# Patient Record
Sex: Male | Born: 1990 | Race: Black or African American | Hispanic: No | Marital: Single | Smoking: Never smoker
Health system: Southern US, Community
[De-identification: ages and names within clinical notes are randomized; demographics above are authoritative.]

## PROBLEM LIST (undated history)

## (undated) DIAGNOSIS — R519 Headache, unspecified: Secondary | ICD-10-CM

## (undated) DIAGNOSIS — R51 Headache: Secondary | ICD-10-CM

---
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
## 2012-05-09 ENCOUNTER — Encounter (HOSPITAL_BASED_OUTPATIENT_CLINIC_OR_DEPARTMENT_OTHER): Payer: Self-pay | Admitting: *Deleted

## 2012-05-09 ENCOUNTER — Emergency Department (HOSPITAL_BASED_OUTPATIENT_CLINIC_OR_DEPARTMENT_OTHER): Payer: Medicaid Other

## 2012-05-09 ENCOUNTER — Emergency Department (HOSPITAL_BASED_OUTPATIENT_CLINIC_OR_DEPARTMENT_OTHER)
Admission: EM | Admit: 2012-05-09 | Discharge: 2012-05-09 | Disposition: A | Discharge status: Home / Self Care | Payer: Medicaid Other | Attending: Emergency Medicine | Admitting: Emergency Medicine

## 2012-05-09 DIAGNOSIS — Y9367 Activity, basketball: Secondary | ICD-10-CM | POA: Insufficient documentation

## 2012-05-09 DIAGNOSIS — S99929A Unspecified injury of unspecified foot, initial encounter: Secondary | ICD-10-CM | POA: Insufficient documentation

## 2012-05-09 DIAGNOSIS — S8991XA Unspecified injury of right lower leg, initial encounter: Secondary | ICD-10-CM

## 2012-05-09 DIAGNOSIS — S8990XA Unspecified injury of unspecified lower leg, initial encounter: Secondary | ICD-10-CM | POA: Insufficient documentation

## 2012-05-09 DIAGNOSIS — X500XXA Overexertion from strenuous movement or load, initial encounter: Secondary | ICD-10-CM | POA: Insufficient documentation

## 2012-05-09 MED ORDER — HYDROCODONE-ACETAMINOPHEN 5-500 MG PO TABS: 1.0000 | ORAL_TABLET | Freq: Four times a day (QID) | ORAL | Status: AC | PRN

## 2012-05-09 MED ORDER — IBUPROFEN 800 MG PO TABS: 800.0000 mg | ORAL_TABLET | Freq: Three times a day (TID) | ORAL | Status: AC | PRN

## 2012-05-09 MED ORDER — IBUPROFEN 800 MG PO TABS
800.0000 mg | ORAL_TABLET | Freq: Once | ORAL | Status: AC
  Administered 2012-05-09: 800 mg via ORAL
  Filled 2012-05-09: qty 1

## 2012-05-09 NOTE — ED Notes (Signed)
Pt c/o right knee injury today while playing basketball

## 2012-05-09 NOTE — Discharge Instructions (Signed)
No weight-bearing until seen by orthopedics.

## 2012-05-09 NOTE — ED Provider Notes (Signed)
History   This chart was scribed for Cyndra Numbers, MD by Toya Smothers. The patient was seen in room MH04/MH04. Patient's care was started at 1758.  CSN: [id number]  Arrival date & time 05/09/12  1758   First MD Initiated Contact with Patient 05/09/12 1823      Chief Complaint  Patient presents with  . Knee Injury    The history is provided by the patient. No language interpreter was used.   Kyle House is a 21 y.o. male who presents to the Emergency Department complaining of sudden onset moderate severe constant R knee pain onset today just prior to arrival. Pt states that he was playing basketball and twisted his knee. Pt has taken no medication and states that he has treated the pain with ice, and that walking aggravates but does not worsen pain. Pain is rated as a 6/10 at rest and sharp.  Pt has no surgical or medical history. There are no other associated or modifying factors.   History reviewed. No pertinent past medical history.  History reviewed. No pertinent past surgical history.  History reviewed. No pertinent family history.  History  Substance Use Topics  . Smoking status: Never Smoker   . Smokeless tobacco: Not on file  . Alcohol Use: No    Review of Systems  Constitutional: Negative.  Negative for fever and chills.  HENT: Negative.  Negative for rhinorrhea and neck pain.   Eyes: Negative.  Negative for pain.  Respiratory: Negative.  Negative for cough and shortness of breath.   Cardiovascular: Negative.  Negative for chest pain.  Gastrointestinal: Negative.  Negative for nausea, vomiting, abdominal pain and diarrhea.  Musculoskeletal: Negative for back pain and gait problem. Arthralgias: R knee pain.       Right knee pain as noted in HPI  Skin: Negative.  Negative for rash.  Neurological: Negative.  Negative for dizziness and weakness.  Hematological: Negative.   Psychiatric/Behavioral: Negative.   All other systems reviewed and are negative.    Allergies    Review of patient's allergies indicates no known allergies.  Home Medications  No current outpatient prescriptions on file.  BP 154/89  Pulse 84  Temp(Src) 98.6 F (37 C) (Oral)  Resp 16  Ht 6\' 1"  (1.854 m)  Wt 200 lb (90.719 kg)  BMI 26.39 kg/m2  SpO2 100%  Physical Exam  Nursing note and vitals reviewed. Constitutional: He is oriented to person, place, and time. He appears well-developed and well-nourished. No distress.  HENT:  Head: Normocephalic and atraumatic.  Eyes: EOM are normal. Pupils are equal, round, and reactive to light.  Neck: Normal range of motion. No tracheal deviation present.  Cardiovascular: Normal rate and regular rhythm.   Pulmonary/Chest: Effort normal. No respiratory distress.  Musculoskeletal: Normal range of motion. He exhibits tenderness. He exhibits no edema.       Tenderness to palpation on varus stress of the right knee. Tenderness on palpation of R lateral Tibial Plateau. No significant ligamentous instability noted.  Neurological: He is alert and oriented to person, place, and time. No sensory deficit. He exhibits normal muscle tone. Coordination normal.  Skin: Skin is warm and dry. No rash noted.  Psychiatric: He has a normal mood and affect. His behavior is normal.    ED Course  Procedures (including critical care time)  DIAGNOSTIC STUDIES: Oxygen Saturation is 100% on room air, normal by my interpretation.    COORDINATION OF CARE: 18:34- Refered to Orthopedist, recommended Ibuprofen and Ice.  Labs  Reviewed - No data to display Dg Knee Complete 4 Views Right  05/09/2012  *RADIOLOGY REPORT*  Clinical Data: Knee injury playing basketball.  RIGHT KNEE - COMPLETE 4+ VIEW  Comparison: None.  Findings: Tiny cortical fragment is identified adjacent to the lateral tibial plateau which may be related to a lateral collateral or posterior corner attachment injury.  No gross fracture.  No subluxation or dislocation.  No substantial effusion is evident  within the suprapatellar bursa.  IMPRESSION: Probable tiny cortical avulsion injury from the non articular cortex of the lateral tibial plateau.  MRI could be used to further evaluate as clinically warranted.  Original Report Authenticated By: ERIC A. MANSELL, M.D.   1. Right knee injury, initial encounter     MDM  Patient was evaluated by myself.  Based on presentation I had no concern for infection or septic joint but was concerned over possible fracture or ligamentous injury.  I reviewed complete right knee series including PA, lateral, and both oblique knee views where small bony fragment was noted at the right lateral tibial plateau.  This was in agreement with radiologist interpretation.  Patient did not have gross ligamentous instability on exam.  He was treated with NSAIDs and discharged with prescription for this and vicodin.  He was told not to bear weight until follow-up with the orthopedist.  Referral provided as well as ice pack and knee immobilizer.  Patient presented with his own crutches.  Patient likely has a meniscal tear.  He was discharged in good condition. I personally performed the services described in this documentation, which was scribed in my presence. The recorded information has been reviewed and considered.         Cyndra Numbers, MD 05/11/12 [phone]

## 2012-06-02 ENCOUNTER — Encounter (HOSPITAL_COMMUNITY): Payer: Self-pay | Admitting: Pharmacy Technician

## 2012-06-03 ENCOUNTER — Other Ambulatory Visit (HOSPITAL_COMMUNITY): Payer: Self-pay | Admitting: Orthopedic Surgery

## 2012-06-06 ENCOUNTER — Encounter (HOSPITAL_COMMUNITY): Payer: Self-pay

## 2012-06-06 ENCOUNTER — Encounter (HOSPITAL_COMMUNITY)
Admission: RE | Admit: 2012-06-06 | Discharge: 2012-06-06 | Disposition: A | Discharge status: Home / Self Care | Payer: Medicaid Other | Source: Ambulatory Visit | Attending: Orthopedic Surgery | Admitting: Orthopedic Surgery

## 2012-06-06 LAB — CBC
MCHC: 34.1 g/dL (ref 30.0–36.0)
RDW: 12.4 % (ref 11.5–15.5)

## 2012-06-06 LAB — BASIC METABOLIC PANEL
BUN: 15 mg/dL (ref 6–23)
Creatinine, Ser: 1.04 mg/dL (ref 0.50–1.35)
GFR calc Af Amer: >90 mL/min (ref >90)
GFR calc non Af Amer: >90 mL/min (ref >90)

## 2012-06-06 LAB — SURGICAL PCR SCREEN: MRSA, PCR: NEGATIVE

## 2012-06-06 NOTE — Progress Notes (Signed)
Patient does not have a primary physician. Kyle House

## 2012-06-06 NOTE — Pre-Procedure Instructions (Signed)
20 Kyle House  06/06/2012   Your procedure is scheduled on:  Tues, July 2 @ 11:20 AM  Report to [facility] at 9:15 AM.  Call this number if you have problems the morning of surgery: [phone]   Remember:   Do not eat food:After Midnight.     Do not wear jewelry  Do not wear lotions, powders, or cologne   Men may shave face and neck.  Do not bring valuables to the hospital.  Contacts, dentures or bridgework may not be worn into surgery.  Leave suitcase in the car. After surgery it may be brought to your room.  For patients admitted to the hospital, checkout time is 11:00 AM the day of discharge.   Patients discharged the day of surgery will not be allowed to drive home.  Special Instructions: CHG Shower Use Special Wash: 1/2 bottle night before surgery and 1/2 bottle morning of surgery.   Please read over the following fact sheets that you were given: Pain Booklet, Coughing and Deep Breathing, MRSA Information and Surgical Site Infection Prevention

## 2012-06-06 NOTE — Progress Notes (Signed)
Notified Dr. Diamantina Providence office regarding need for orders for patient surgery tomorrow. Kyle House

## 2012-06-07 ENCOUNTER — Inpatient Hospital Stay (HOSPITAL_COMMUNITY)
Admission: RE | Admit: 2012-06-07 | Discharge: 2012-06-08 | DRG: 489 | Disposition: A | Discharge status: Home / Self Care | Payer: Medicaid Other | Source: Ambulatory Visit | Attending: Orthopedic Surgery | Admitting: Orthopedic Surgery

## 2012-06-07 ENCOUNTER — Encounter (HOSPITAL_COMMUNITY)
Admission: RE | Disposition: A | Discharge status: Home / Self Care | Payer: Self-pay | Source: Ambulatory Visit | Attending: Orthopedic Surgery

## 2012-06-07 ENCOUNTER — Encounter (HOSPITAL_COMMUNITY): Payer: Self-pay | Admitting: Certified Registered"

## 2012-06-07 ENCOUNTER — Ambulatory Visit (HOSPITAL_COMMUNITY): Payer: Medicaid Other | Admitting: Certified Registered"

## 2012-06-07 ENCOUNTER — Encounter (HOSPITAL_COMMUNITY): Payer: Self-pay | Admitting: *Deleted

## 2012-06-07 DIAGNOSIS — S83509A Sprain of unspecified cruciate ligament of unspecified knee, initial encounter: Principal | ICD-10-CM | POA: Diagnosis present

## 2012-06-07 DIAGNOSIS — S83289A Other tear of lateral meniscus, current injury, unspecified knee, initial encounter: Secondary | ICD-10-CM | POA: Diagnosis present

## 2012-06-07 DIAGNOSIS — S83519A Sprain of anterior cruciate ligament of unspecified knee, initial encounter: Secondary | ICD-10-CM

## 2012-06-07 DIAGNOSIS — X58XXXA Exposure to other specified factors, initial encounter: Secondary | ICD-10-CM | POA: Diagnosis present

## 2012-06-07 DIAGNOSIS — Z01812 Encounter for preprocedural laboratory examination: Secondary | ICD-10-CM

## 2012-06-07 HISTORY — PX: ANTERIOR CRUCIATE LIGAMENT REPAIR: SHX115

## 2012-06-07 SURGERY — RECONSTRUCTION, KNEE, ACL, USING HAMSTRING GRAFT
Anesthesia: General | Site: Knee | Laterality: Right | Wound class: Clean

## 2012-06-07 MED ORDER — HYDROMORPHONE HCL PF 1 MG/ML IJ SOLN
0.2500 mg | INTRAMUSCULAR | Status: DC | PRN
  Administered 2012-06-07 (×3): 0.5 mg via INTRAVENOUS

## 2012-06-07 MED ORDER — NEOSTIGMINE METHYLSULFATE 1 MG/ML IJ SOLN
INTRAMUSCULAR | Status: DC | PRN
  Administered 2012-06-07: 2 mg via INTRAVENOUS

## 2012-06-07 MED ORDER — ONDANSETRON HCL 4 MG PO TABS: 4.0000 mg | ORAL_TABLET | Freq: Four times a day (QID) | ORAL | Status: DC | PRN

## 2012-06-07 MED ORDER — CEFAZOLIN SODIUM 1-5 GM-% IV SOLN
INTRAVENOUS | Status: DC | PRN
  Administered 2012-06-07: 2 g via INTRAVENOUS

## 2012-06-07 MED ORDER — KETOROLAC TROMETHAMINE 30 MG/ML IJ SOLN
INTRAMUSCULAR | Status: AC
  Filled 2012-06-07: qty 1

## 2012-06-07 MED ORDER — ROCURONIUM BROMIDE 100 MG/10ML IV SOLN
INTRAVENOUS | Status: DC | PRN
  Administered 2012-06-07: 50 mg via INTRAVENOUS
  Administered 2012-06-07: 10 mg via INTRAVENOUS

## 2012-06-07 MED ORDER — METOCLOPRAMIDE HCL 10 MG PO TABS: 5.0000 mg | ORAL_TABLET | Freq: Three times a day (TID) | ORAL | Status: DC | PRN

## 2012-06-07 MED ORDER — CEFAZOLIN SODIUM 1-5 GM-% IV SOLN
INTRAVENOUS | Status: AC
  Filled 2012-06-07: qty 100

## 2012-06-07 MED ORDER — ACETAMINOPHEN 10 MG/ML IV SOLN
INTRAVENOUS | Status: DC | PRN
  Administered 2012-06-07: 1000 mg via INTRAVENOUS

## 2012-06-07 MED ORDER — METHOCARBAMOL 500 MG PO TABS: 500.0000 mg | ORAL_TABLET | Freq: Four times a day (QID) | ORAL | Status: DC | PRN

## 2012-06-07 MED ORDER — SODIUM CHLORIDE 0.9 % IR SOLN
Status: DC | PRN
  Administered 2012-06-07 (×5): 3000 mL

## 2012-06-07 MED ORDER — PROMETHAZINE HCL 25 MG/ML IJ SOLN: 6.2500 mg | INTRAMUSCULAR | Status: DC | PRN

## 2012-06-07 MED ORDER — POTASSIUM CHLORIDE IN NACL 20-0.9 MEQ/L-% IV SOLN
INTRAVENOUS | Status: DC
  Filled 2012-06-07 (×3): qty 1000

## 2012-06-07 MED ORDER — MIDAZOLAM HCL 5 MG/5ML IJ SOLN
INTRAMUSCULAR | Status: DC | PRN
  Administered 2012-06-07 (×2): 2 mg via INTRAVENOUS

## 2012-06-07 MED ORDER — FENTANYL CITRATE 0.05 MG/ML IJ SOLN
INTRAMUSCULAR | Status: DC | PRN
  Administered 2012-06-07: 100 ug via INTRAVENOUS
  Administered 2012-06-07 (×8): 50 ug via INTRAVENOUS

## 2012-06-07 MED ORDER — METOCLOPRAMIDE HCL 5 MG/ML IJ SOLN: 5.0000 mg | Freq: Three times a day (TID) | INTRAMUSCULAR | Status: DC | PRN

## 2012-06-07 MED ORDER — GLYCOPYRROLATE 0.2 MG/ML IJ SOLN
INTRAMUSCULAR | Status: DC | PRN
  Administered 2012-06-07: 0.3 mg via INTRAVENOUS

## 2012-06-07 MED ORDER — PROPOFOL 10 MG/ML IV EMUL
INTRAVENOUS | Status: DC | PRN
  Administered 2012-06-07: 200 mg via INTRAVENOUS

## 2012-06-07 MED ORDER — 0.9 % SODIUM CHLORIDE (POUR BTL) OPTIME
TOPICAL | Status: DC | PRN
  Administered 2012-06-07: 1000 mL

## 2012-06-07 MED ORDER — HYDROMORPHONE HCL PF 1 MG/ML IJ SOLN
INTRAMUSCULAR | Status: AC
  Filled 2012-06-07: qty 1

## 2012-06-07 MED ORDER — ONDANSETRON HCL 4 MG/2ML IJ SOLN: 4.0000 mg | Freq: Four times a day (QID) | INTRAMUSCULAR | Status: DC | PRN

## 2012-06-07 MED ORDER — MORPHINE SULFATE 4 MG/ML IJ SOLN
INTRAMUSCULAR | Status: AC
  Filled 2012-06-07: qty 1

## 2012-06-07 MED ORDER — ACETAMINOPHEN 10 MG/ML IV SOLN
INTRAVENOUS | Status: AC
  Filled 2012-06-07: qty 100

## 2012-06-07 MED ORDER — CEFAZOLIN SODIUM 1-5 GM-% IV SOLN
1.0000 g | Freq: Three times a day (TID) | INTRAVENOUS | Status: DC
  Administered 2012-06-07 (×2): 1 g via INTRAVENOUS
  Filled 2012-06-07 (×3): qty 50

## 2012-06-07 MED ORDER — KETOROLAC TROMETHAMINE 30 MG/ML IJ SOLN
15.0000 mg | Freq: Once | INTRAMUSCULAR | Status: AC | PRN
  Administered 2012-06-07: 30 mg via INTRAVENOUS

## 2012-06-07 MED ORDER — METHOCARBAMOL 100 MG/ML IJ SOLN
500.0000 mg | Freq: Four times a day (QID) | INTRAVENOUS | Status: DC | PRN
  Filled 2012-06-07: qty 5

## 2012-06-07 MED ORDER — ONDANSETRON HCL 4 MG/2ML IJ SOLN
INTRAMUSCULAR | Status: DC | PRN
  Administered 2012-06-07: 4 mg via INTRAVENOUS

## 2012-06-07 MED ORDER — BUPIVACAINE HCL (PF) 0.25 % IJ SOLN
INTRAMUSCULAR | Status: DC | PRN
  Administered 2012-06-07: 5 mL

## 2012-06-07 MED ORDER — HYDROMORPHONE HCL PF 1 MG/ML IJ SOLN
0.5000 mg | INTRAMUSCULAR | Status: DC | PRN
  Administered 2012-06-07: 1 mg via INTRAVENOUS
  Filled 2012-06-07: qty 1

## 2012-06-07 MED ORDER — HYDROMORPHONE HCL PF 1 MG/ML IJ SOLN
INTRAMUSCULAR | Status: AC
  Administered 2012-06-07: 1 mg via INTRAVENOUS
  Filled 2012-06-07: qty 1

## 2012-06-07 MED ORDER — MORPHINE SULFATE 4 MG/ML IJ SOLN
INTRAMUSCULAR | Status: DC | PRN
  Administered 2012-06-07: 4 mg via INTRAVENOUS

## 2012-06-07 MED ORDER — OXYCODONE HCL 5 MG PO TABS
10.0000 mg | ORAL_TABLET | ORAL | Status: DC | PRN
  Administered 2012-06-08: 10 mg via ORAL
  Filled 2012-06-07: qty 2

## 2012-06-07 MED ORDER — METHOCARBAMOL 100 MG/ML IJ SOLN
500.0000 mg | INTRAVENOUS | Status: AC
  Administered 2012-06-07: 500 mg via INTRAVENOUS
  Filled 2012-06-07: qty 5

## 2012-06-07 MED ORDER — LIDOCAINE HCL (CARDIAC) 20 MG/ML IV SOLN
INTRAVENOUS | Status: DC | PRN
  Administered 2012-06-07: 80 mg via INTRAVENOUS

## 2012-06-07 MED ORDER — KETOROLAC TROMETHAMINE 15 MG/ML IJ SOLN
15.0000 mg | Freq: Four times a day (QID) | INTRAMUSCULAR | Status: DC
  Administered 2012-06-07 – 2012-06-08 (×3): 15 mg via INTRAVENOUS
  Filled 2012-06-07 (×5): qty 1

## 2012-06-07 MED ORDER — MEPERIDINE HCL 25 MG/ML IJ SOLN: 6.2500 mg | INTRAMUSCULAR | Status: DC | PRN

## 2012-06-07 MED ORDER — BUPIVACAINE HCL (PF) 0.25 % IJ SOLN
INTRAMUSCULAR | Status: AC
  Filled 2012-06-07: qty 30

## 2012-06-07 MED ORDER — CLONIDINE HCL (ANALGESIA) 100 MCG/ML EP SOLN
EPIDURAL | Status: DC | PRN
  Administered 2012-06-07: 1 mL

## 2012-06-07 MED ORDER — LACTATED RINGERS IV SOLN
INTRAVENOUS | Status: DC
  Administered 2012-06-07 (×2): via INTRAVENOUS

## 2012-06-07 SURGICAL SUPPLY — 98 items
ANCHOR BUTTON TIGHTROPE ACL RT (Orthopedic Implant) ×2 IMPLANT
BANDAGE ELASTIC 6 VELCRO ST LF (GAUZE/BANDAGES/DRESSINGS) IMPLANT
BANDAGE ESMARK 6X9 LF (GAUZE/BANDAGES/DRESSINGS) ×1 IMPLANT
BENZOIN TINCTURE PRP APPL 2/3 (GAUZE/BANDAGES/DRESSINGS) ×4 IMPLANT
BLADE CUDA 5.5 (BLADE) IMPLANT
BLADE GREAT WHITE 4.2 (BLADE) ×2 IMPLANT
BLADE SURG 10 STRL SS (BLADE) ×2 IMPLANT
BLADE SURG 15 STRL LF DISP TIS (BLADE) ×1 IMPLANT
BLADE SURG 15 STRL SS (BLADE) ×1
BNDG ELASTIC 6X15 VLCR STRL LF (GAUZE/BANDAGES/DRESSINGS) ×2 IMPLANT
BNDG ESMARK 6X9 LF (GAUZE/BANDAGES/DRESSINGS) ×2
BUR OVAL 6.0 (BURR) ×2 IMPLANT
CINCH MENISCAL (Anchor) ×1 IMPLANT
CLOTH BEACON ORANGE TIMEOUT ST (SAFETY) ×2 IMPLANT
COVER SURGICAL LIGHT HANDLE (MISCELLANEOUS) ×2 IMPLANT
CUFF TOURNIQUET SINGLE 34IN LL (TOURNIQUET CUFF) ×2 IMPLANT
CUFF TOURNIQUET SINGLE 44IN (TOURNIQUET CUFF) IMPLANT
CUTTER KNOT PUSHER 2-0 FIBERWI (INSTRUMENTS) ×2 IMPLANT
DECANTER SPIKE VIAL GLASS SM (MISCELLANEOUS) IMPLANT
DRAPE ARTHROSCOPY W/POUCH 114 (DRAPES) ×2 IMPLANT
DRAPE INCISE IOBAN 66X45 STRL (DRAPES) ×4 IMPLANT
DRAPE U-SHAPE 47X51 STRL (DRAPES) ×2 IMPLANT
DRILL FLIPCUTTER II 8.0MM (INSTRUMENTS) ×1 IMPLANT
DRSG PAD ABDOMINAL 8X10 ST (GAUZE/BANDAGES/DRESSINGS) ×2 IMPLANT
ELECT REM PT RETURN 9FT ADLT (ELECTROSURGICAL) ×2
ELECTRODE REM PT RTRN 9FT ADLT (ELECTROSURGICAL) ×1 IMPLANT
EVACUATOR 1/8 PVC DRAIN (DRAIN) IMPLANT
FIBERSTICK 2 (SUTURE) ×2 IMPLANT
FLIPCUTTER II 8.0MM (INSTRUMENTS) ×2
GAUZE XEROFORM 1X8 LF (GAUZE/BANDAGES/DRESSINGS) ×2 IMPLANT
GLOVE BIO SURGEON ST LM GN SZ9 (GLOVE) IMPLANT
GLOVE BIOGEL PI IND STRL 6.5 (GLOVE) ×1 IMPLANT
GLOVE BIOGEL PI IND STRL 7.0 (GLOVE) ×1 IMPLANT
GLOVE BIOGEL PI IND STRL 7.5 (GLOVE) ×1 IMPLANT
GLOVE BIOGEL PI IND STRL 8 (GLOVE) ×1 IMPLANT
GLOVE BIOGEL PI IND STRL 9 (GLOVE) IMPLANT
GLOVE BIOGEL PI INDICATOR 6.5 (GLOVE) ×1
GLOVE BIOGEL PI INDICATOR 7.0 (GLOVE) ×1
GLOVE BIOGEL PI INDICATOR 7.5 (GLOVE) ×1
GLOVE BIOGEL PI INDICATOR 8 (GLOVE) ×1
GLOVE BIOGEL PI INDICATOR 9 (GLOVE)
GLOVE ECLIPSE 7.0 STRL STRAW (GLOVE) ×4 IMPLANT
GLOVE SURG ORTHO 8.0 STRL STRW (GLOVE) ×2 IMPLANT
GLOVE SURG SS PI 6.5 STRL IVOR (GLOVE) ×2 IMPLANT
GLOVE SURG SS PI 7.5 STRL IVOR (GLOVE) ×6 IMPLANT
GOWN PREVENTION PLUS LG XLONG (DISPOSABLE) ×2 IMPLANT
GOWN PREVENTION PLUS XLARGE (GOWN DISPOSABLE) IMPLANT
GOWN STRL NON-REIN LRG LVL3 (GOWN DISPOSABLE) ×10 IMPLANT
IMMOBILIZER KNEE 24 THIGH 36 (MISCELLANEOUS) ×1 IMPLANT
IMMOBILIZER KNEE 24 UNIV (MISCELLANEOUS) ×2
KIT BASIN OR (CUSTOM PROCEDURE TRAY) ×2 IMPLANT
KIT ROOM TURNOVER OR (KITS) ×2 IMPLANT
KIT TRANSTIBIAL (DISPOSABLE) IMPLANT
MANIFOLD NEPTUNE II (INSTRUMENTS) ×2 IMPLANT
MENISCAL CINCH (Anchor) ×2 IMPLANT
NEEDLE 18GX1X1/2 (RX/OR ONLY) (NEEDLE) ×2 IMPLANT
NS IRRIG 1000ML POUR BTL (IV SOLUTION) ×2 IMPLANT
PACK ARTHROSCOPY DSU (CUSTOM PROCEDURE TRAY) ×2 IMPLANT
PAD ARMBOARD 7.5X6 YLW CONV (MISCELLANEOUS) ×2 IMPLANT
PAD CAST 4YDX4 CTTN HI CHSV (CAST SUPPLIES) IMPLANT
PADDING CAST COTTON 4X4 STRL (CAST SUPPLIES)
PADDING CAST COTTON 6X4 STRL (CAST SUPPLIES) ×6 IMPLANT
PASSER SUT SWANSON 36MM LOOP (INSTRUMENTS) IMPLANT
PENCIL BUTTON HOLSTER BLD 10FT (ELECTRODE) ×2 IMPLANT
REAMER C 10MM (INSTRUMENTS) IMPLANT
SCREW BIO INTERF 8X28 (Screw) ×2 IMPLANT
SCREW LO PRO 25MM (Screw) ×2 IMPLANT
SET ARTHROSCOPY TUBING (MISCELLANEOUS) ×1
SET ARTHROSCOPY TUBING LN (MISCELLANEOUS) ×1 IMPLANT
SPONGE GAUZE 4X4 12PLY (GAUZE/BANDAGES/DRESSINGS) ×2 IMPLANT
SPONGE LAP 4X18 X RAY DECT (DISPOSABLE) ×4 IMPLANT
SPONGE SCRUB IODOPHOR (GAUZE/BANDAGES/DRESSINGS) IMPLANT
STRIP CLOSURE SKIN 1/2X4 (GAUZE/BANDAGES/DRESSINGS) ×2 IMPLANT
SUCTION FRAZIER TIP 10 FR DISP (SUCTIONS) IMPLANT
SUT 2 FIBERLOOP 20 STRT BLUE (SUTURE) ×4
SUT ETHILON 3 0 PS 1 (SUTURE) ×2 IMPLANT
SUT FIBERWIRE #2 26 STR GREEN (SUTURE)
SUT FIBERWIRE #2 38 T-5 BLUE (SUTURE) ×2
SUT MENISCAL KIT (KITS) ×2 IMPLANT
SUT PROLENE 3 0 PS 2 (SUTURE) ×2 IMPLANT
SUT VIC AB 0 CT1 27 (SUTURE) ×2
SUT VIC AB 0 CT1 27XBRD ANBCTR (SUTURE) ×2 IMPLANT
SUT VIC AB 2-0 CT1 27 (SUTURE) ×2
SUT VIC AB 2-0 CT1 TAPERPNT 27 (SUTURE) ×2 IMPLANT
SUT VICRYL 0 TIES 12 18 (SUTURE) ×2 IMPLANT
SUTURE 2 FIBERLOOP 20 STRT BLU (SUTURE) ×2 IMPLANT
SUTURE FIBERWR #2 26 STR GREEN (SUTURE) IMPLANT
SUTURE FIBERWR #2 38 T-5 BLUE (SUTURE) ×1 IMPLANT
SYR 30ML LL (SYRINGE) IMPLANT
SYR 30ML SLIP (SYRINGE) ×2 IMPLANT
SYR BULB IRRIGATION 50ML (SYRINGE) ×2 IMPLANT
SYR TB 1ML LUER SLIP (SYRINGE) ×2 IMPLANT
TOWEL OR 17X24 6PK STRL BLUE (TOWEL DISPOSABLE) ×2 IMPLANT
TOWEL OR 17X26 10 PK STRL BLUE (TOWEL DISPOSABLE) ×2 IMPLANT
UNDERPAD 30X30 INCONTINENT (UNDERPADS AND DIAPERS) IMPLANT
WAND 90 DEG TURBOVAC W/CORD (SURGICAL WAND) IMPLANT
WATER STERILE IRR 1000ML POUR (IV SOLUTION) ×2 IMPLANT
WRAP KNEE MAXI GEL POST OP (GAUZE/BANDAGES/DRESSINGS) ×2 IMPLANT

## 2012-06-07 NOTE — Progress Notes (Signed)
Spoke with NURSE IN OR ROOM WITH DR August Saucer RE; ORDERS NOT BEING SIGNED AND CONSENT WILL NEED SIGNED ON OR.

## 2012-06-07 NOTE — Anesthesia Postprocedure Evaluation (Signed)
  Anesthesia Post-op Note  Patient: Kyle House  Procedure(s) Performed: Procedure(s) (LRB): RECONSTRUCTION ANTERIOR CRUCIATE LIGAMENT (ACL) WITH HAMSTRING GRAFT (Right)  Patient Location: PACU  Anesthesia Type: General  Level of Consciousness: awake, alert  and oriented  Airway and Oxygen Therapy: Patient Spontanous Breathing  Post-op Pain: mild  Post-op Assessment: Post-op Vital signs reviewed and Patient's Cardiovascular Status Stable  Post-op Vital Signs: stable  Complications: No apparent anesthesia complications

## 2012-06-07 NOTE — Transfer of Care (Signed)
Immediate Anesthesia Transfer of Care Note  Patient: Kyle House  Procedure(s) Performed: Procedure(s) (LRB): RECONSTRUCTION ANTERIOR CRUCIATE LIGAMENT (ACL) WITH HAMSTRING GRAFT (Right)  Patient Location: PACU  Anesthesia Type: General  Level of Consciousness: awake, alert  and oriented  Airway & Oxygen Therapy: Patient Spontanous Breathing  Post-op Assessment: Report given to PACU RN and Post -op Vital signs reviewed and stable  Post vital signs: Reviewed and stable  Complications: No apparent anesthesia complications

## 2012-06-07 NOTE — Brief Op Note (Signed)
06/07/2012  2:48 PM  PATIENT:  Kyle House  21 y.o. male  PRE-OPERATIVE DIAGNOSIS:  Right knee ACL tear, lateral meniscal tear  POST-OPERATIVE DIAGNOSIS:  Right knee ACL tear, lateral meniscal tear  PROCEDURE:  Procedure(s): RECONSTRUCTION ANTERIOR CRUCIATE LIGAMENT (ACL) WITH HAMSTRING GRAFT lateral meniscal tear repair  SURGEON:  Surgeon(s): Cammy Copa, MD  ASSISTANT: Jodene Nam  ANESTHESIA:   general  EBL: 25 ml    Total I/O In: 1000 [I.V.:1000] Out: -   BLOOD ADMINISTERED: none  DRAINS: none   LOCAL MEDICATIONS USED:  none  SPECIMEN:  No Specimen  COUNTS:  YES  TOURNIQUET:   Total Tourniquet Time Documented: Thigh (Right) - 120 minutes  DICTATION: .Other Dictation: Dictation Number [id number]  PLAN OF CARE: Admit for overnight observation  PATIENT DISPOSITION:  PACU - hemodynamically stable

## 2012-06-07 NOTE — Anesthesia Procedure Notes (Signed)
Procedure Name: Intubation Date/Time: 06/07/2012 12:03 PM Performed by: Jefm Miles E Pre-anesthesia Checklist: Patient identified, Timeout performed, Emergency Drugs available, Suction available and Patient being monitored Patient Re-evaluated:Patient Re-evaluated prior to inductionOxygen Delivery Method: Circle system utilized Preoxygenation: Pre-oxygenation with 100% oxygen Intubation Type: IV induction Ventilation: Mask ventilation without difficulty Laryngoscope Size: Mac and 3 Grade View: Grade I Tube type: Oral Tube size: 7.5 mm Number of attempts: 1 Airway Equipment and Method: Stylet Placement Confirmation: ETT inserted through vocal cords under direct vision,  breath sounds checked- equal and bilateral and positive ETCO2 Secured at: 23 cm Tube secured with: Tape Dental Injury: Teeth and Oropharynx as per pre-operative assessment

## 2012-06-07 NOTE — Progress Notes (Signed)
NOTIFIED DR MASSAGEE OF PATIENT DRINKING COFFEE MUG OF WATER 730 THIS AM.

## 2012-06-07 NOTE — H&P (Signed)
Kyle House is an 21 y.o. male.   Chief Complaint: Right knee pain HPI: Kyle House is an 21 year old patient who injured his right knee during a sporting activity several weeks ago. He described immediate pop and swelling and has had symptomatic instability since that time. Evaluation in the office demonstrated positive Lockman examination effusion stable collateral ligaments. Subsequent MRI scanning did show torn anterior cruciate ligament with potential tearing of the lateral meniscus as well. Patient desired to continue to play basketball and will be enrolling at [employer] in the fall. There is no family history of deep vein thrombosis.  History reviewed. No pertinent past medical history.  History reviewed. No pertinent past surgical history.  History reviewed. No pertinent family history. Social History:  reports that he has never smoked. He does not have any smokeless tobacco history on file. He reports that he does not drink alcohol or use illicit drugs.  Allergies: No Known Allergies  No prescriptions prior to admission    Results for orders placed during the hospital encounter of 06/06/12 (from the past 48 hour(s))  BASIC METABOLIC PANEL     Status: Normal   Collection Time   06/06/12  9:24 AM      Component Value Range Comment   Sodium 140  135 - 145 mEq/L    Potassium 4.1  3.5 - 5.1 mEq/L    Chloride 103  96 - 112 mEq/L    CO2 28  19 - 32 mEq/L    Glucose, Bld 90  70 - 99 mg/dL    BUN 15  6 - 23 mg/dL    Creatinine, Ser 1.61  0.50 - 1.35 mg/dL    Calcium 9.9  8.4 - 09.6 mg/dL    GFR calc non Af Amer >90  >90 mL/min    GFR calc Af Amer >90  >90 mL/min   CBC     Status: Normal   Collection Time   06/06/12  9:24 AM      Component Value Range Comment   WBC 7.6  4.0 - 10.5 K/uL    RBC 5.42  4.22 - 5.81 MIL/uL    Hemoglobin 15.6  13.0 - 17.0 g/dL    HCT 04.5  40.9 - 81.1 %    MCV 84.5  78.0 - 100.0 fL    MCH 28.8  26.0 - 34.0 pg    MCHC 34.1  30.0 - 36.0 g/dL    RDW 91.4   78.2 - 95.6 %    Platelets 234  150 - 400 K/uL   SURGICAL PCR SCREEN     Status: Normal   Collection Time   06/06/12  9:27 AM      Component Value Range Comment   MRSA, PCR NEGATIVE  NEGATIVE    Staphylococcus aureus NEGATIVE  NEGATIVE    No results found.  Review of Systems  Constitutional: Negative.   HENT: Negative.   Eyes: Negative.   Respiratory: Negative.   Cardiovascular: Negative.   Gastrointestinal: Negative.   Genitourinary: Negative.   Musculoskeletal: Positive for joint pain.  Skin: Negative.   Neurological: Negative.   Endo/Heme/Allergies: Negative.   Psychiatric/Behavioral: Negative.     Blood pressure 132/87, pulse 72, temperature 97.9 F (36.6 C), temperature source Oral, resp. rate 18, SpO2 100.00%. Physical Exam  Constitutional: He appears well-developed.  HENT:  Head: Normocephalic.  Eyes: Pupils are equal, round, and reactive to light.  Neck: Normal range of motion.  Cardiovascular: Normal rate.   Respiratory: Effort normal.  GI: Soft.  Neurological: He is alert.  Skin: Skin is warm.   examination of the right knee demonstrates trace effusion intact extension mechanism stable collateral ligaments positive Lockman no posterior lateral rotatory instability palpable pedal pulses soft compartments for range of motion except for the last 10 of flexion with medial and lateral joint line tenderness . Compartments are soft. Dorsiflexion plantarflexion of the foot is intact on the right.  Assessment/Plan Impression is right knee anterior cruciate ligament tear in an active patient he wants to continue playing basketball. MRI scan does show anterior cruciate ligament tear and possible lateral meniscal tearing. He does have some patellar tendinitis and therefore graft option choice will be hamstring autograft. Risk and benefits of the procedure are discussed with the patient including but not limited to infection nerve vessel damage area of numbness on the lateral  aspect of the leg AP thrombosis decrease in range of motion and inability to return to sports. The long duration of the rehabilitation process as also described to the patient in detail. Alll questions answered patient understands the risk and benefits and wishes to proceed  Kyle House SCOTT 06/07/2012, 11:27 AM

## 2012-06-07 NOTE — Anesthesia Preprocedure Evaluation (Addendum)
Anesthesia Evaluation  Patient identified by MRN, date of birth, ID band Patient awake    Reviewed: Allergy & Precautions, H&P , NPO status , Patient's Chart, lab work & pertinent test results  History of Anesthesia Complications Negative for: history of anesthetic complications  Airway Mallampati: I  Neck ROM: full    Dental No notable dental hx. (+) Teeth Intact and Dental Advisory Given   Pulmonary neg pulmonary ROS,  breath sounds clear to auscultation  Pulmonary exam normal + wheezing      Cardiovascular negative cardio ROS  IRhythm:regular Rate:Normal     Neuro/Psych negative neurological ROS  negative psych ROS   GI/Hepatic negative GI ROS, Neg liver ROS,   Endo/Other  negative endocrine ROS  Renal/GU negative Renal ROS  negative genitourinary   Musculoskeletal   Abdominal   Peds  Hematology negative hematology ROS (+)   Anesthesia Other Findings   Reproductive/Obstetrics negative OB ROS                         Anesthesia Physical Anesthesia Plan  ASA: I  Anesthesia Plan: General and General ETT   Post-op Pain Management:    Induction:   Airway Management Planned:   Additional Equipment:   Intra-op Plan:   Post-operative Plan:   Informed Consent: I have reviewed the patients History and Physical, chart, labs and discussed the procedure including the risks, benefits and alternatives for the proposed anesthesia with the patient or authorized representative who has indicated his/her understanding and acceptance.     Plan Discussed with: CRNA and Surgeon  Anesthesia Plan Comments:         Anesthesia Quick Evaluation

## 2012-06-07 NOTE — Preoperative (Signed)
Beta Blockers   Reason not to administer Beta Blockers:Not Applicable 

## 2012-06-08 ENCOUNTER — Encounter (HOSPITAL_COMMUNITY): Payer: Self-pay | Admitting: Orthopedic Surgery

## 2012-06-08 MED ORDER — ASPIRIN 325 MG PO TABS
325.0000 mg | ORAL_TABLET | Freq: Every day | ORAL | Status: DC
  Administered 2012-06-08: 325 mg via ORAL
  Filled 2012-06-08: qty 1

## 2012-06-08 MED ORDER — OXYCODONE HCL 10 MG PO TABS: 10.0000 mg | ORAL_TABLET | ORAL | Status: AC | PRN

## 2012-06-08 MED ORDER — METHOCARBAMOL 500 MG PO TABS: 500.0000 mg | ORAL_TABLET | Freq: Four times a day (QID) | ORAL | Status: AC | PRN

## 2012-06-08 MED ORDER — ASPIRIN 325 MG PO TABS: 325.0000 mg | ORAL_TABLET | Freq: Every day | ORAL | Status: AC

## 2012-06-08 NOTE — Progress Notes (Signed)
UR COMPLETED  

## 2012-06-08 NOTE — Progress Notes (Signed)
Pt stable Right foot mobilie - perfused and sensate Plan dc today - after pt and cpm PT to discuss home exercise program

## 2012-06-08 NOTE — Op Note (Signed)
NAMEARI, BERNABEI NO.:  [id number]  MEDICAL RECORD NO.:  [id number]  LOCATION:  [id number]                        FACILITY:  [facility]  PHYSICIAN:  Kyle House, M.D.    DATE OF BIRTH:  31-May-1991  DATE OF PROCEDURE:  06/07/2012 DATE OF DISCHARGE:                              OPERATIVE REPORT   PREOPERATIVE DIAGNOSIS:  Right knee anterior cruciate ligament tear, lateral meniscal tear.  POSTOPERATIVE DIAGNOSIS:  Right knee anterior cruciate ligament tear, lateral meniscal tear.  PROCEDURE:  Right knee ACL reconstruction with hamstring autograft, lateral meniscal repair, all inside.  SURGEON:  Kyle Bunting, MD  ASSISTANT:  Kyle Neighbors, PA  ANESTHESIA:  General endotracheal.  ESTIMATED BLOOD LOSS:  20 mL.  DRAINS:  None.  INDICATIONS:  Kyle House is a patient with right knee ACL tear and lateral meniscal tear, presents for operative management after explanation of risks and benefits.  PROCEDURE IN DETAIL:  The patient was brought to the operating room where general endotracheal anesthesia was induced.  Preoperative antibiotics were administered.  Right leg was examined under anesthesia. He was found to have full range of motion with ACL laxity in the anterior drawer.  No posterolateral rotatory instability was noted.  PCL was intact.  The patient's right leg was prescrubbed with alcohol and Betadine, which allowed to air dry, prepped with DuraPrep solution and draped in a sterile manner.  Topographical anatomy was made.  Kyle House was used to cover the operative field.  Time-out was called.  Leg was elevated and exsanguinated with an Esmarch wrap.  Tourniquet was inflated.  Hamstring and semitendinosus tendons were harvested.  An 8-mm graft was prepared on the back table by Kyle House.  Care was taken to avoid injury to the saphenous vein and nerve.  The anterior-inferior medial and anterior-inferior lateral portals were established. Diagnostic  arthroscopy was performed.  The patient had intact medial compartment, articular cartilage, and meniscus, torn ACL, intact PCL, tear of the lateral meniscus extending to the popliteus from the posterolateral corner.  This was a partial thickness tear about 80% from the superior surface inferiorly.  Did not penetrate to the inferior surface and there was no instability to the tear.  This area was rasped with a meniscal rasp.  Parameniscal synovium was rasped.  One Arthrex meniscal cinch was then placed.  After deploying the device and making minor adjustments, the meniscus was fixed.  Because of the proximity to the posterior aspect of the posterolateral meniscal root, no further sutures were placed.  This partial thickness meniscal tear although nearly full thickness.  Should have good opportunity to heel as all of it was posterior to the popliteus.  At this time, patellofemoral compartment was inspected and found to be intact.  No loose bodies in medial lateral gutter.  Notchplasty was performed.  ACL tear was debrided.  The guide was placed on the femoral side and an 8-mm tunnel was drilled.  This was at approximately the 9 o'clock position.  At this time, the tibial tunnel was drilled the native ACL footprint.  This was dilated up to 8.5 mm, which was the thickness of the tibial portion of the stump.  Graft was then passed with Endobutton fixation on the femoral side.  On the tibial side, it was tied over a 25-mm post and supplemented with interference screw.  This was done with the knee in extension.  The knee was taken through range of motion and found to have excellent stability, 1 mm anterior drawer with solid endpoint.  Meniscal fixation was stable.  Tourniquet was released at this time after 2 hours.  Thorough irrigation of the knee joint was performed.  Incisions were closed using 3-0 nylon.  On the portals, graft harvest incision was closed using 2-0 Vicryl, 3-0 pullout Prolene.   Kyle House's assistance was required all times during the case for retraction for neurovascular structures, limb positioning, graft passage, graft preparation.  Her assistance was a medical necessity.     Kyle House, M.D.     GSD/MEDQ  D:  06/07/2012  T:  06/08/2012  Job:  [id number]

## 2012-06-08 NOTE — Progress Notes (Signed)
Occupational Therapy Evaluation Patient Details Name: Johngabriel Verde MRN: [id number] DOB: July 09, 1991 Today's Date: 06/08/2012 Time: 0900-0920 OT Time Calculation (min): 20 min  OT Assessment / Plan / Recommendation Clinical Impression  21 yo s/p ACL repair and meniscal repair. completed all education regarding ADL and mobilty for ADL. No equipment needs. Pt will have 24/7 support after D/C. no further OT needs.    OT Assessment  Patient does not need any further OT services    Follow Up Recommendations  No OT follow up    Barriers to Discharge      Equipment Recommendations  None recommended by OT    Recommendations for Other Services    Frequency       Precautions / Restrictions Precautions Precautions: Knee Precaution Comments: PWB. ex per protocal Restrictions Weight Bearing Restrictions: Yes RLE Weight Bearing: Partial weight bearing RLE Partial Weight Bearing Percentage or Pounds: 50   Pertinent Vitals/Pain 5. No pain meds requested    ADL  Grooming: Set up Upper Body Bathing: Set up Where Assessed - Upper Body Bathing: Supported sit to stand Lower Body Bathing: Set up Where Assessed - Lower Body Bathing: Supported sit to stand Upper Body Dressing: Set up Where Assessed - Upper Body Dressing: Supported sitting Lower Body Dressing: Set up;Supervision/safety Where Assessed - Lower Body Dressing: Supported sit to stand Toilet Transfer: Buyer, retail Method: Squat pivot Transfers/Ambulation Related to ADLs: S for functional transfer ADL Comments: sister able to assist as needed. Completted education regarding ADL    OT Diagnosis:    OT Problem List:   OT Treatment Interventions:     OT Goals Acute Rehab OT Goals OT Goal Formulation:  (eval only)  Visit Information  Last OT Received On: 06/08/12    Subjective Data      Prior Functioning  Home Living Lives With: Family Available Help at Discharge: Family;Available 24  hours/day Type of Home: House Home Access: Stairs to enter [employer] of Steps: 2 Home Layout: One level Bathroom Shower/Tub: Forensic scientist: Standard Bathroom Accessibility: Yes How Accessible: Accessible via walker Home Adaptive Equipment: Crutches Prior Function Level of Independence: Independent;Independent with assistive device(s) (used crutches PTA since injury) Able to Take Stairs?: Yes Driving: No Vocation: Holiday representative Communication: No difficulties Dominant Hand: Right    Cognition  Overall Cognitive Status: Appears within functional limits for tasks assessed/performed Arousal/Alertness: Awake/alert Orientation Level: Appears intact for tasks assessed Behavior During Session: [facility] for tasks performed    Extremity/Trunk Assessment Right Upper Extremity Assessment RUE ROM/Strength/Tone: [facility] for tasks assessed Left Upper Extremity Assessment LUE ROM/Strength/Tone: WFL for tasks assessed Trunk Assessment Trunk Assessment: Normal   Mobility Bed Mobility Bed Mobility: Supine to Sit Supine to Sit: 7: Independent Transfers Transfers: Sit to Stand;Stand to Sit Sit to Stand: 6: Modified independent (Device/Increase time) Stand to Sit: 6: Modified independent (Device/Increase time) Details for Transfer Assistance: Good technique. Able to maintain WBS   Exercise    Balance Balance Balance Assessed:  (WFL for tasks assessed)  End of Session OT - End of Session Equipment Utilized During Treatment: Gait belt Activity Tolerance: Patient tolerated treatment well Patient left: in chair;with family/visitor present Nurse Communication: Mobility status  GO     Ravina Milner,HILLARY 06/08/2012, 9:35 AM [facility], OTR/L  [phone] 06/08/2012

## 2012-06-08 NOTE — Care Management Note (Signed)
  Page 1 of 1   06/08/2012     1:41:40 PM   CARE MANAGEMENT NOTE 06/08/2012  Patient:  Kyle House, Kyle House   Account Number:  [id number]  Date Initiated:  06/08/2012  Documentation initiated by:  Anette Guarneri  Subjective/Objective Assessment:   POD#1 s/p Right knee ACL reconstruction with hamstring autograft,  lateral meniscal repair, all inside  Will need CPM at d/c     Action/Plan:   discharge home with DME   Anticipated DC Date:  06/08/2012   Anticipated DC Plan:  HOME/SELF CARE      DC Planning Services  CM consult      Choice offered to / List presented to:             Status of service:  Completed, signed off Medicare Important Message given?   (If response is "NO", the following Medicare IM given date fields will be blank) Date Medicare IM given:   Date Additional Medicare IM given:    Discharge Disposition:  HOME/SELF CARE  Per UR Regulation:  Reviewed for med. necessity/level of care/duration of stay  If discussed at Long Length of Stay Meetings, dates discussed:    Comments:  06/08/12 13:36 Anette Guarneri RN/CM Spoke with patient and father regarding need for CPM Per father Medicaid will not cover CPM and he cannot afford cost, Was seen by [employer] per father and they agreed to work out special rate but per father he told them he would think about it and let them know. Father states that he has contact card for [employer]. Confirmed this with Rhonda/[employer]. Patient will d/c home without CPM, notified Dr. August Saucer.

## 2012-06-08 NOTE — Evaluation (Signed)
Physical Therapy Evaluation Patient Details Name: Kyle House MRN: [id number] DOB: 04-Jun-1991 Today's Date: 06/08/2012 Time: 4098-1191 PT Time Calculation (min): 25 min  PT Assessment / Plan / Recommendation Clinical Impression  Pt s/p R ACL reconstruction presenting with minimal R knee pain, decreased R knee ROM and R LE strength. Educated on importance of not overdoing it due to the graft needing to take. Patient with good understanding of HEP and PWB of R LE. Patient safe to d/c home today with family.    PT Assessment  Patient needs continued PT services    Follow Up Recommendations  Outpatient PT;Supervision - Intermittent (when approved by MD)    Barriers to Discharge None      Equipment Recommendations  None recommended by PT (patient already has crutches)    Recommendations for Other Services     Frequency Min 3X/week    Precautions / Restrictions Precautions Precautions: Knee Precaution Comments: PWB. ex per protocal Required Braces or Orthoses: Knee Immobilizer - Right Restrictions RLE Weight Bearing: Partial weight bearing RLE Partial Weight Bearing Percentage or Pounds: 50   Pertinent Vitals/Pain 3/10 R knee pain      Mobility  Bed Mobility Bed Mobility: Not assessed (pt received up in chair) Transfers Sit to Stand: 6: Modified independent (Device/Increase time) (with crutches and arm rest) Stand to Sit: 6: Modified independent (Device/Increase time) Details for Transfer Assistance: demo'd good technique Ambulation/Gait Ambulation/Gait Assistance: 4: Min guard (due to first time up ambulating) Ambulation Distance (Feet): 100 Feet Assistive device: Crutches Ambulation/Gait Assistance Details: instructed patient on how to amb with crutches with R LE PWB at 50%. Patient reports only about 25% wbing. Gait Pattern: Step-through pattern;Decreased step length - right;Decreased stance time - right Gait velocity: wfl for injury Stairs: Yes Stairs Assistance: 4:  Min guard Stair Management Technique: No rails;With crutches Number of Stairs: 4     Exercises Total Joint Exercises Quad Sets: AROM;Right;10 reps;Supine (with 5 sec hold) Heel Slides: AAROM;Right;10 reps;Supine   PT Diagnosis: Difficulty walking;Abnormality of gait;Generalized weakness;Acute pain  PT Problem List: Decreased strength;Decreased range of motion;Decreased activity tolerance;Decreased mobility PT Treatment Interventions: Functional mobility training;Therapeutic activities;Therapeutic exercise   PT Goals Acute Rehab PT Goals PT Goal Formulation: With patient Time For Goal Achievement: 06/15/12 Potential to Achieve Goals: Good Pt will go Supine/Side to Sit: Independently;with HOB 0 degrees PT Goal: Supine/Side to Sit - Progress: Goal set today Pt will go Sit to Stand: Independently PT Goal: Sit to Stand - Progress: Goal set today Pt will Ambulate: >150 feet;with modified independence;with crutches (with R LE PWB at 50%.) PT Goal: Ambulate - Progress: Goal set today Pt will Go Up / Down Stairs: 1-2 stairs;with modified independence;with crutches PT Goal: Up/Down Stairs - Progress: Goal set today Pt will Perform Home Exercise Program: Independently PT Goal: Perform Home Exercise Program - Progress: Goal set today  Visit Information  Last PT Received On: 06/08/12 Assistance Needed: +1    Subjective Data  Subjective: Pt received sitting up in chair with report of 5/10 R knee pain.   Prior Functioning  Home Living Lives With: Family Available Help at Discharge: Family;Available 24 hours/day Type of Home: House Home Access: Stairs to enter [employer] of Steps: 2 Home Layout: One level Bathroom Shower/Tub: Forensic scientist: Standard Bathroom Accessibility: Yes How Accessible: Accessible via walker Home Adaptive Equipment: Crutches Prior Function Level of Independence: Independent;Independent with assistive device(s) (used  crutches PTA since injury) Able to Take Stairs?: Yes Driving: No Vocation: Consulting civil engineer  Communication Communication: No difficulties Dominant Hand: Right    Cognition  Overall Cognitive Status: Appears within functional limits for tasks assessed/performed Arousal/Alertness: Awake/alert Orientation Level: Appears intact for tasks assessed Behavior During Session: [facility] for tasks performed    Extremity/Trunk Assessment Right Upper Extremity Assessment RUE ROM/Strength/Tone: [facility] for tasks assessed Left Upper Extremity Assessment LUE ROM/Strength/Tone: WFL for tasks assessed Right Lower Extremity Assessment RLE ROM/Strength/Tone: Deficits;Due to pain;Due to precautions RLE ROM/Strength/Tone Deficits: pt able to complete quad set, hip/ankle WFl Left Lower Extremity Assessment LLE ROM/Strength/Tone: Within functional levels LLE Coordination: WFL - gross/fine motor Trunk Assessment Trunk Assessment: Normal   Balance    End of Session PT - End of Session Equipment Utilized During Treatment: Gait belt;Right knee immobilizer Activity Tolerance: Patient tolerated treatment well Patient left: in chair;with call bell/phone within reach;with family/visitor present Nurse Communication: Mobility status  GP     Marcene Brawn 06/08/2012, 1:30 PM  Lewis Shock, PT, DPT Pager #: [phone] Office #: [phone]

## 2012-06-12 NOTE — Discharge Summary (Signed)
Physician Discharge Summary  Patient ID: Kyle House MRN: [id number] DOB/AGE: 08-18-1991 21 y.o.  Admit date: 06/07/2012 Discharge date: 06/08/2012 Admission Diagnoses:  acl tear  Discharge Diagnoses:  Same  Surgeries: Procedure(s): RECONSTRUCTION ANTERIOR CRUCIATE LIGAMENT (ACL) WITH HAMSTRING GRAFT on 06/07/2012   Consultants:    Discharged Condition: Stable  Hospital Course: Kyle House is an 21 y.o. male who was admitted 06/07/2012 with a chief complaint of knee pain, and found to have a diagnosis of acl tear.  They were brought to the operating room on 06/07/2012 and underwent the above named procedures.    Antibiotics given:  Anti-infectives     Start     Dose/Rate Route Frequency Ordered Stop   06/07/12 1745   ceFAZolin (ANCEF) IVPB 1 g/50 mL premix  Status:  Discontinued        1 g 100 mL/hr over 30 Minutes Intravenous 3 times per day 06/07/12 1733 06/08/12 1648        .  Recent vital signs:  Filed Vitals:   06/08/12 0614  BP: 134/76  Pulse: 73  Temp: 98.1 F (36.7 C)  Resp: 20    Recent laboratory studies:  Results for orders placed during the hospital encounter of 06/06/12  SURGICAL PCR SCREEN      Component Value Range   MRSA, PCR NEGATIVE  NEGATIVE   Staphylococcus aureus NEGATIVE  NEGATIVE  BASIC METABOLIC PANEL      Component Value Range   Sodium 140  135 - 145 mEq/L   Potassium 4.1  3.5 - 5.1 mEq/L   Chloride 103  96 - 112 mEq/L   CO2 28  19 - 32 mEq/L   Glucose, Bld 90  70 - 99 mg/dL   BUN 15  6 - 23 mg/dL   Creatinine, Ser 4.09  0.50 - 1.35 mg/dL   Calcium 9.9  8.4 - 81.1 mg/dL   GFR calc non Af Amer >90  >90 mL/min   GFR calc Af Amer >90  >90 mL/min  CBC      Component Value Range   WBC 7.6  4.0 - 10.5 K/uL   RBC 5.42  4.22 - 5.81 MIL/uL   Hemoglobin 15.6  13.0 - 17.0 g/dL   HCT 91.4  78.2 - 95.6 %   MCV 84.5  78.0 - 100.0 fL   MCH 28.8  26.0 - 34.0 pg   MCHC 34.1  30.0 - 36.0 g/dL   RDW 21.3  08.6 - 57.8 %   Platelets 234  150 -  400 K/uL    Discharge Medications:   Medication List  As of 06/12/2012 10:04 AM   TAKE these medications         aspirin 325 MG tablet   Take 1 tablet (325 mg total) by mouth daily.      methocarbamol 500 MG tablet   Commonly known as: ROBAXIN   Take 1 tablet (500 mg total) by mouth every 6 (six) hours as needed.      Oxycodone HCl 10 MG Tabs   Take 1 tablet (10 mg total) by mouth every 4 (four) hours as needed.            Diagnostic Studies: No results found.  Disposition: 01-Home or Self Care  Discharge Orders    Future Orders Please Complete By Expires   Diet - low sodium heart healthy      Call MD / Call 911      Comments:   If you experience chest  pain or shortness of breath, CALL 911 and be transported to the hospital emergency room.  If you develope a fever above 101 F, pus (white drainage) or increased drainage or redness at the wound, or calf pain, call your surgeon's office.   Constipation Prevention      Comments:   Drink plenty of fluids.  Prune juice may be helpful.  You may use a stool softener, such as Colace (over the counter) 100 mg twice a day.  Use MiraLax (over the counter) for constipation as needed.   Increase activity slowly as tolerated      Discharge instructions      Comments:   1. Partial weight bearing as tolerated with crutches. 2. Do home exercise program 1 hour per day focusing on fully straightening the leg and bending the knee. 3. Keep incision dry   Change dressing      Scheduling Instructions:   Please use mepilex to cover incisions and rewrap knee with ace wrap         Signed: DEAN,GREGORY SCOTT 06/12/2012, 10:04 AM

## 2012-07-07 ENCOUNTER — Ambulatory Visit: Payer: Medicaid Other | Attending: Orthopedic Surgery | Admitting: Physical Therapy

## 2012-07-07 DIAGNOSIS — IMO0001 Reserved for inherently not codable concepts without codable children: Secondary | ICD-10-CM | POA: Insufficient documentation

## 2012-07-07 DIAGNOSIS — R5381 Other malaise: Secondary | ICD-10-CM | POA: Insufficient documentation

## 2012-07-07 DIAGNOSIS — M25669 Stiffness of unspecified knee, not elsewhere classified: Secondary | ICD-10-CM | POA: Insufficient documentation

## 2012-07-07 DIAGNOSIS — M6281 Muscle weakness (generalized): Secondary | ICD-10-CM | POA: Insufficient documentation

## 2012-07-11 ENCOUNTER — Ambulatory Visit: Payer: Medicaid Other

## 2012-07-12 ENCOUNTER — Ambulatory Visit: Payer: Medicaid Other | Admitting: Physical Therapy

## 2012-07-19 ENCOUNTER — Ambulatory Visit: Payer: Medicaid Other | Admitting: Physical Therapy

## 2012-07-20 ENCOUNTER — Ambulatory Visit: Payer: Medicaid Other | Admitting: Rehabilitation

## 2012-07-22 ENCOUNTER — Ambulatory Visit: Payer: Medicaid Other | Admitting: Rehabilitation

## 2012-07-25 ENCOUNTER — Encounter: Payer: Medicaid Other | Admitting: Rehabilitation

## 2012-07-27 ENCOUNTER — Encounter: Payer: Medicaid Other | Admitting: Rehabilitation

## 2012-07-29 ENCOUNTER — Encounter: Payer: Medicaid Other | Admitting: Rehabilitation

## 2013-08-17 IMAGING — CR DG KNEE COMPLETE 4+V*R*
4 series · 4 of 4 positions shown · non-contrast
Comparison: None.

CLINICAL DATA: Knee injury playing basketball.

RIGHT KNEE - COMPLETE 4+ VIEW

[t knee ap right]
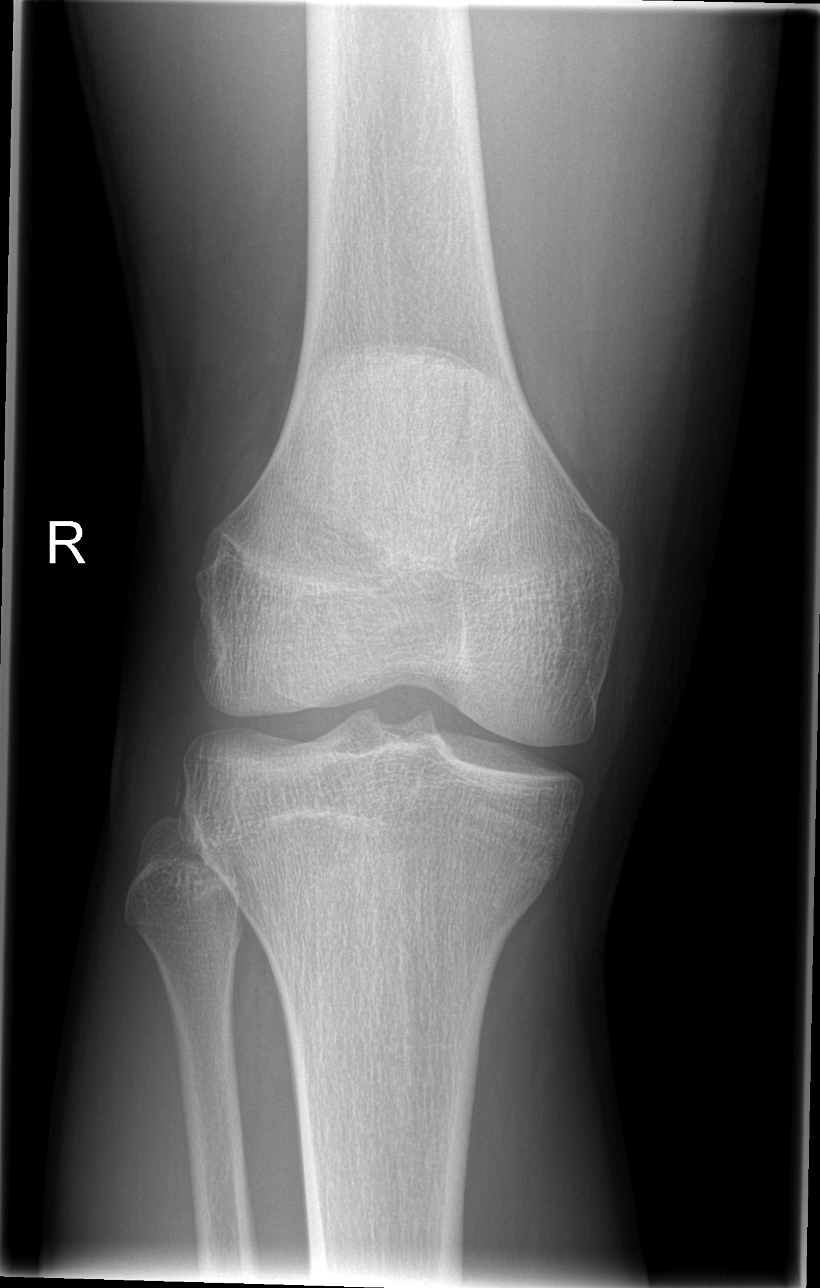

[t knee oblique right (1 of 2)]
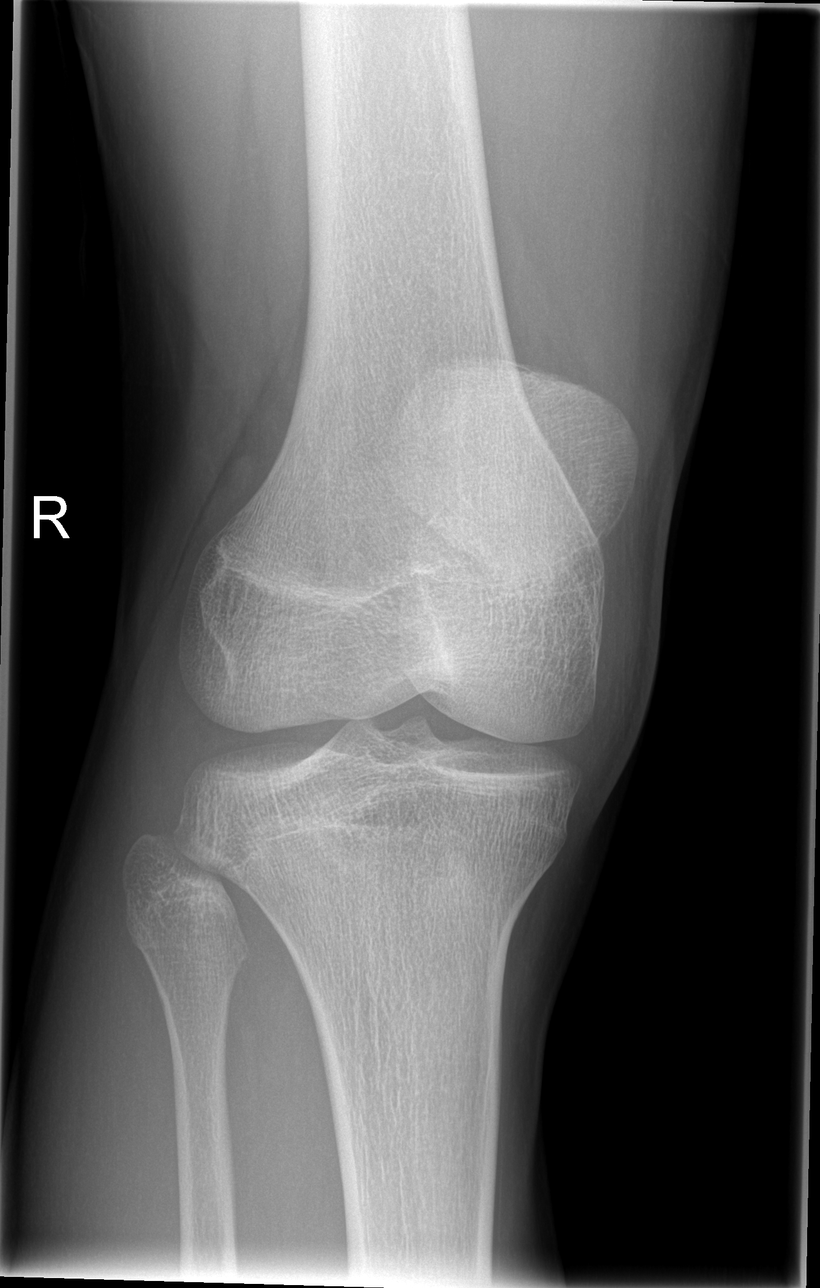

[t knee oblique right (2 of 2)]
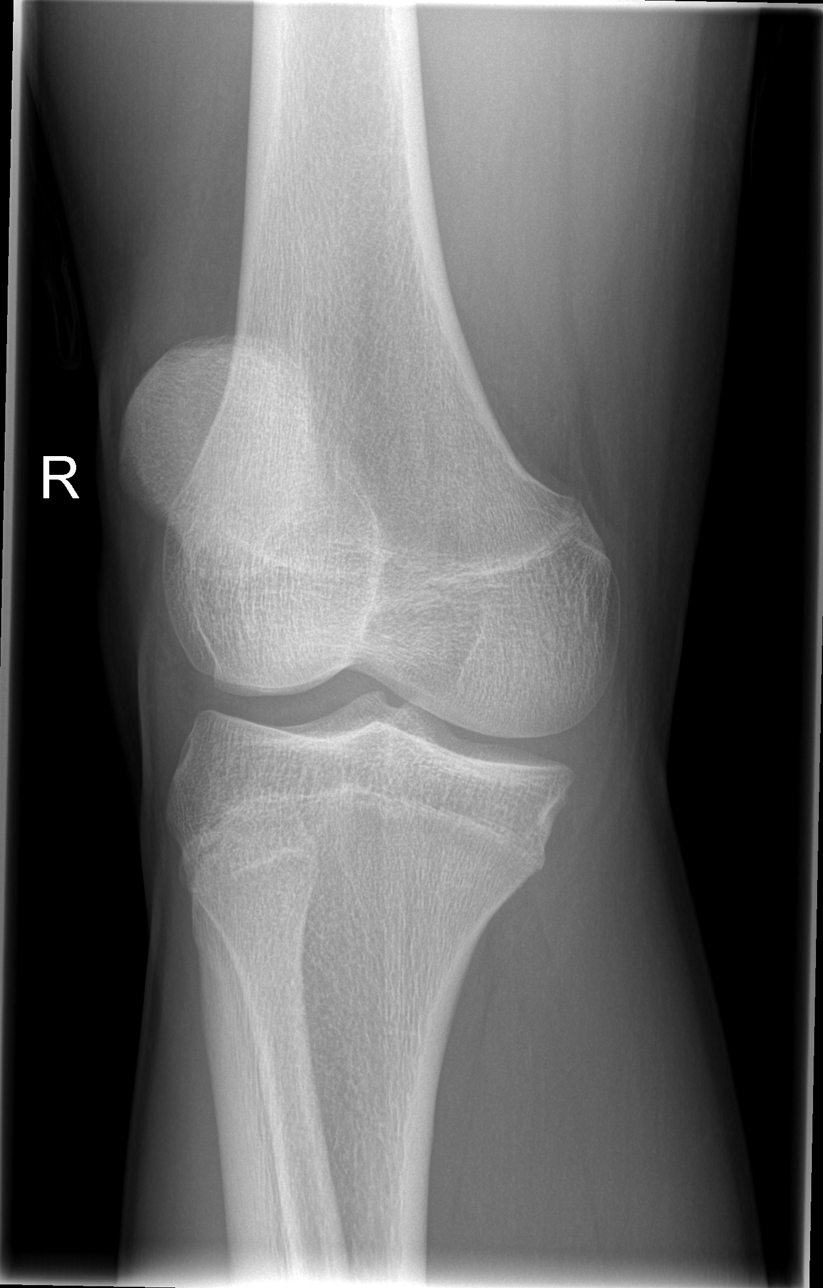

[t knee lat right]
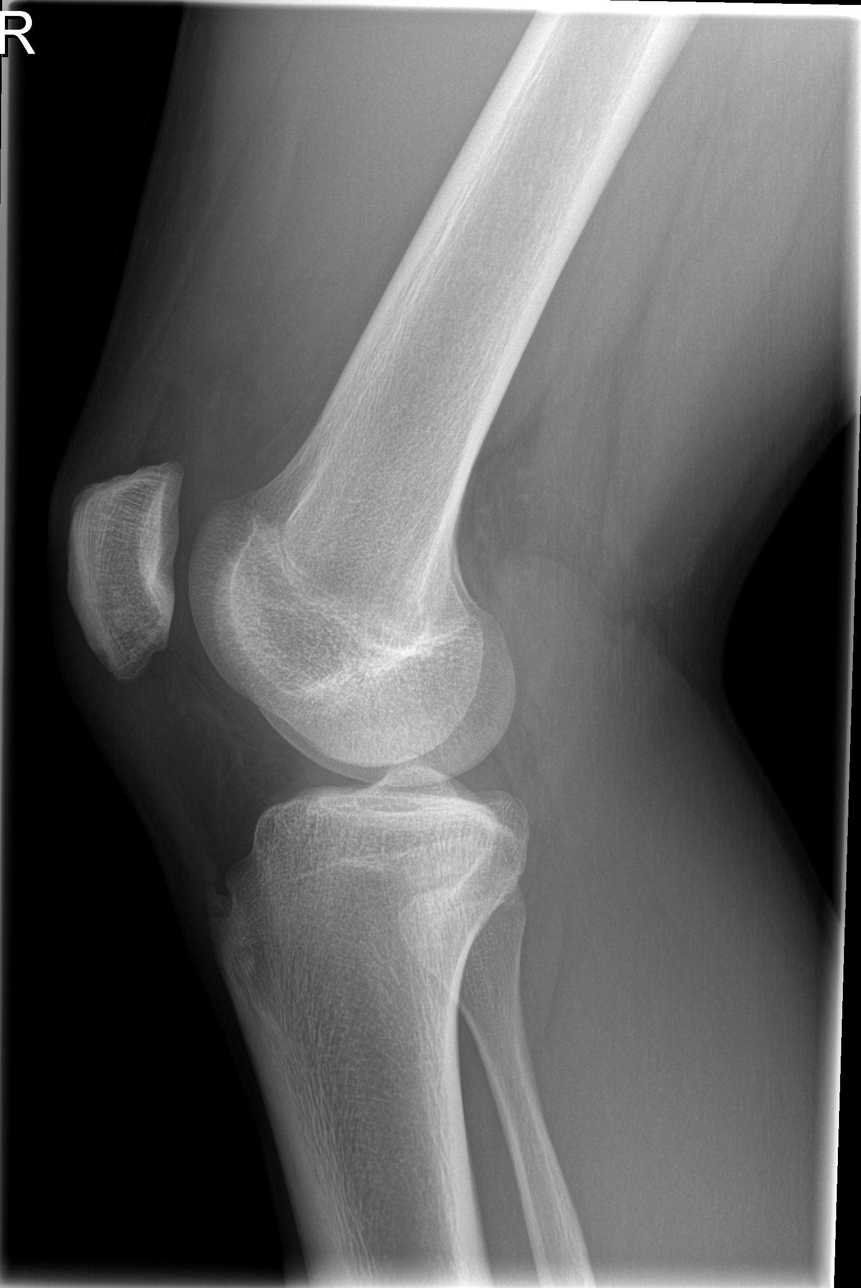

[4 of 4 positions shown; findings below may reference images not displayed]

FINDINGS: Tiny cortical fragment is identified adjacent to the
lateral tibial plateau which may be related to a lateral collateral
or posterior corner attachment injury.  No gross fracture.  No
subluxation or dislocation.  No substantial effusion is evident
within the suprapatellar bursa.
IMPRESSION: Probable tiny cortical avulsion injury from the non articular
cortex of the lateral tibial plateau.  MRI could be used to further
evaluate as clinically warranted.

## 2017-09-21 ENCOUNTER — Emergency Department (HOSPITAL_COMMUNITY)
Admission: EM | Admit: 2017-09-21 | Discharge: 2017-09-21 | Disposition: A | Discharge status: Home / Self Care | Payer: Self-pay | Attending: Emergency Medicine | Admitting: Emergency Medicine

## 2017-09-21 ENCOUNTER — Encounter (HOSPITAL_COMMUNITY): Payer: Self-pay | Admitting: *Deleted

## 2017-09-21 DIAGNOSIS — G44019 Episodic cluster headache, not intractable: Secondary | ICD-10-CM | POA: Insufficient documentation

## 2017-09-21 HISTORY — DX: Headache, unspecified: R51.9

## 2017-09-21 HISTORY — DX: Headache: R51

## 2017-09-21 MED ORDER — BUTALBITAL-APAP-CAFFEINE 50-325-40 MG PO TABS: 1.0000 | ORAL_TABLET | Freq: Four times a day (QID) | ORAL | 0 refills | Status: AC | PRN

## 2017-09-21 NOTE — ED Provider Notes (Signed)
Kyle [facility] EMERGENCY DEPARTMENT Provider Note   CSN: [id number] Arrival date & time: 09/21/17  1216     History   Chief Complaint Chief Complaint  Patient presents with  . Headache    HPI Kyle House is a 26 y.o. male.  HPI  Kyle House is a 26 y.o. male with hx of headaches, presents to ED with complaint of headache. Pt states he has had intermittent headaches on the right side of the face, behind right eye. Headaches come and go, sometimes lasting sometimes several days. States headaches are associated with photophobia, rhinorrea, eye watering. Denies associated nausea or vomiting. No numbness or weakness in extremities. Some blurred vision.  No head injuries.  No other complaints. States pain 10/10 prior to arrival, now has resolved   Past Medical History:  Diagnosis Date  . Headache     There are no active problems to display for this patient.   Past Surgical History:  Procedure Laterality Date  . ANTERIOR CRUCIATE LIGAMENT REPAIR  06/07/2012   Procedure: RECONSTRUCTION ANTERIOR CRUCIATE LIGAMENT (ACL) WITH HAMSTRING GRAFT;  Surgeon: Cammy Copa, MD;  Location: [facility] OR;  Service: Orthopedics;  Laterality: Right;  Right knee ACL reconstruction with hamstring autograft, meniscal debridement versus repair       Home Medications    Prior to Admission medications   Not on File    Family History No family history on file.  Social History Social History  Substance Use Topics  . Smoking status: Never Smoker  . Smokeless tobacco: Not on file  . Alcohol use No     Allergies   Patient has no known allergies.   Review of Systems Review of Systems  Constitutional: Negative for chills and fever.  HENT: Positive for congestion.   Eyes: Positive for pain and discharge.  Respiratory: Negative for cough, chest tightness and shortness of breath.   Cardiovascular: Negative for chest pain, palpitations and leg swelling.    Gastrointestinal: Negative for abdominal distention, abdominal pain, diarrhea, nausea and vomiting.  Genitourinary: Negative for dysuria, frequency, hematuria and urgency.  Musculoskeletal: Negative for arthralgias, myalgias, neck pain and neck stiffness.  Skin: Negative for rash.  Allergic/Immunologic: Negative for immunocompromised state.  Neurological: Positive for headaches. Negative for dizziness, weakness, light-headedness and numbness.  All other systems reviewed and are negative.    Physical Exam Updated Vital Signs BP 123/77 (BP Location: Left Arm)   Pulse 65   Temp 98 F (36.7 C) (Oral)   Resp 16   Ht  (1.88 m)   Wt 86.2 kg (190 lb)   SpO2 100%   BMI 24.39 kg/m   Physical Exam  Constitutional: He is oriented to person, place, and time. He appears well-developed and well-nourished. No distress.  HENT:  Head: Normocephalic and atraumatic.  Mouth/Throat: Oropharynx is clear and moist.  TMs normal bilaterally.  Eyes: Pupils are equal, round, and reactive to light. EOM are normal. Right eye exhibits no discharge. Left eye exhibits no discharge.  Right conjunctiva injected  Neck: Normal range of motion. Neck supple.  No meningismus  Cardiovascular: Normal rate, regular rhythm and normal heart sounds.   Pulmonary/Chest: Effort normal and breath sounds normal. No respiratory distress.  Abdominal: He exhibits no distension.  Musculoskeletal: He exhibits no edema.  Neurological: He is alert and oriented to person, place, and time. No cranial nerve deficit. He exhibits normal muscle tone. Coordination normal.  5/5 and equal upper and lower extremity strength bilaterally. Equal grip  strength bilaterally. Normal finger to nose and heel to shin. No pronator drift.   Skin: Skin is warm and dry. No rash noted.  Psychiatric: He has a normal mood and affect. His behavior is normal.  Nursing note and vitals reviewed.    ED Treatments / Results  Labs (all labs ordered are  listed, but only abnormal results are displayed) Labs Reviewed - No data to display  EKG  EKG Interpretation None       Radiology No results found.  Procedures Procedures (including critical care time)  Medications Ordered in ED Medications - No data to display   Initial Impression / Assessment and Plan / ED Course  I have reviewed the triage vital signs and the nursing notes.  Pertinent labs & imaging results that were available during my care of the patient were reviewed by me and considered in my medical decision making (see chart for details).     Patient in emergency department with symptoms typical of cluster headache. His headache currently resolved. Normal neurological exam. No complaints. Discussed with Dr. Fayrene Fearing, will refer to neurologist. Will give pain medications to treat pain when it is severe. Discussed treatment plan with patient who agrees.  Vitals:   09/21/17 1226  BP: 123/77  Pulse: 65  Resp: 16  Temp: 98 F (36.7 C)  SpO2: 100%     Final Clinical Impressions(s) / ED Diagnoses   Final diagnoses:  Episodic cluster headache, not intractable    New Prescriptions New Prescriptions   No medications on file     Jaynie Crumble, Cordelia Poche 09/22/17 1543    Rolland Porter, MD 09/26/17 2352

## 2017-09-21 NOTE — ED Triage Notes (Signed)
To ED for eval of tension headaches. States he has been dealing with them for years and would like to know how to prevent them. States his HA stopped as soon as he sat in triage chair. No vomiting or nausea with headaches.

## 2017-09-21 NOTE — Discharge Instructions (Signed)
Take ibuprofen for pain. Fioricet for severe pain. Follow up with neurology

## 2017-12-29 ENCOUNTER — Encounter (HOSPITAL_COMMUNITY): Payer: Self-pay | Admitting: Emergency Medicine

## 2017-12-29 ENCOUNTER — Ambulatory Visit (HOSPITAL_COMMUNITY)
Admission: EM | Admit: 2017-12-29 | Discharge: 2017-12-29 | Disposition: A | Discharge status: Home / Self Care | Payer: Self-pay | Attending: Family Medicine | Admitting: Family Medicine

## 2017-12-29 DIAGNOSIS — R51 Headache: Secondary | ICD-10-CM

## 2017-12-29 DIAGNOSIS — R519 Headache, unspecified: Secondary | ICD-10-CM

## 2017-12-29 NOTE — ED Triage Notes (Signed)
PT C/O: intermittent pain onset 3 years but has flared up today  Pain is on the right side that increases w/bright light   Sts pain is "extreme"  DENIES: fevers, n/v  TAKING MEDS: ibuprofen   A&O x4... NAD... Ambulatory

## 2017-12-30 NOTE — ED Provider Notes (Signed)
  [facility]   [id number] 12/29/17 Arrival Time: 1053  ASSESSMENT & PLAN:  1. Nonintractable episodic headache, unspecified headache type   Long standing h/o same headaches. Recommend that he see a neurologist. Reassured of normal PE. HA has almost resolved completely today.  Reviewed expectations re: course of current medical issues. Questions answered. Outlined signs and symptoms indicating need for more acute intervention. Patient verbalized understanding. After Visit Summary given.   SUBJECTIVE:  Kyle House is a 27 y.o. male who presents with complaint of headaches 1-2x/month for the past 3 years. Describes pain around forehead and temples; throbs. No associated n/v/visual changes. No aura. Some photophobia. "Really hurts when it starts then eases off after I take ibuprofen." Did take ibuprofen a couple of hours ago and HA is resolving. HA does not limit him. "Just wanted to ask some questions." No recent life changes. No new medications. Normal diet. Feels like he is sleeping well. ROS: As per HPI.   OBJECTIVE:  Vitals:   12/29/17 1119  BP: 112/70  Pulse: 63  Resp: 20  Temp: 98.4 F (36.9 C)  TempSrc: Oral  SpO2: 100%    General appearance: alert; no distress Eyes: PERRLA; EOMI; conjunctiva normal HENT: normocephalic; atraumatic Neck: supple with FROM Lungs: clear to auscultation bilaterally Heart: regular rate and rhythm Extremities: no edema; symmetrical with no gross deformities Skin: warm and dry Neurologic: CN 2-12 grossly intact; normal gait; normal symmetric reflexes; normal extremity strength and sensation throughout Psychological: alert and cooperative; normal mood and affect  No Known Allergies  Past Medical History:  Diagnosis Date  . Headache    Social History   Socioeconomic History  . Marital status: Single    Spouse name: Not on file  . Number of children: Not on file  . Years of education: Not on file  . Highest education  level: Not on file  Social Needs  . Financial resource strain: Not on file  . Food insecurity - worry: Not on file  . Food insecurity - inability: Not on file  . Transportation needs - medical: Not on file  . Transportation needs - non-medical: Not on file  Occupational History  . Not on file  Tobacco Use  . Smoking status: Never Smoker  . Smokeless tobacco: Never Used  Substance and Sexual Activity  . Alcohol use: No  . Drug use: No  . Sexual activity: No  Other Topics Concern  . Not on file  Social History Narrative  . Not on file   History reviewed. No pertinent family history. Past Surgical History:  Procedure Laterality Date  . ANTERIOR CRUCIATE LIGAMENT REPAIR  06/07/2012   Procedure: RECONSTRUCTION ANTERIOR CRUCIATE LIGAMENT (ACL) WITH HAMSTRING GRAFT;  Surgeon: Cammy CopaGregory Scott Dean, MD;  [facility];  Service: Orthopedics;  Laterality: Right;  Right knee ACL reconstruction with hamstring autograft, meniscal debridement versus repair     Mardella LaymanHagler, Jhanae Jaskowiak, MD 12/30/17 [id number]
